# Patient Record
Sex: Male | Born: 2000 | Race: White | Hispanic: No | Marital: Single
Health system: Southern US, Community
[De-identification: ages and names within clinical notes are randomized; demographics above are authoritative.]

## PROBLEM LIST (undated history)

## (undated) DIAGNOSIS — F909 Attention-deficit hyperactivity disorder, unspecified type: Secondary | ICD-10-CM

## (undated) DIAGNOSIS — R011 Cardiac murmur, unspecified: Secondary | ICD-10-CM

## (undated) HISTORY — PX: ADENOIDECTOMY: SHX5191

## (undated) HISTORY — PX: TONSILLECTOMY: SUR1361

---
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
## 2021-04-09 ENCOUNTER — Ambulatory Visit: Payer: Medicaid Other | Admitting: Pediatrics

## 2021-04-10 ENCOUNTER — Ambulatory Visit (INDEPENDENT_AMBULATORY_CARE_PROVIDER_SITE_OTHER): Payer: Medicaid Other | Admitting: Pediatrics

## 2021-04-10 ENCOUNTER — Encounter: Payer: Self-pay | Admitting: Pediatrics

## 2021-04-10 ENCOUNTER — Other Ambulatory Visit (HOSPITAL_COMMUNITY)
Admission: RE | Admit: 2021-04-10 | Discharge: 2021-04-10 | Disposition: A | Discharge status: Home / Self Care | Payer: Medicaid Other | Source: Ambulatory Visit | Attending: Pediatrics | Admitting: Pediatrics

## 2021-04-10 ENCOUNTER — Other Ambulatory Visit: Payer: Self-pay

## 2021-04-10 VITALS — Wt 176.3 lb

## 2021-04-10 DIAGNOSIS — Z113 Encounter for screening for infections with a predominantly sexual mode of transmission: Secondary | ICD-10-CM

## 2021-04-10 DIAGNOSIS — L723 Sebaceous cyst: Secondary | ICD-10-CM

## 2021-04-10 NOTE — Progress Notes (Addendum)
   Subjective:    Patient ID: Drew Rasmussen, male    DOB: 01/06/2001, 20 y.o.   MRN: [id number]  HPI Chief Complaint  Patient presents with   SAME DAY    PAIN IN INNER RIGHT THIGH NEAR GROIN; NOTICED LAST Thursday AND STATED THAT THERE IS NO PAIN AND IT HAS GONE DOWN SINCE WHEN HE FIRST NOTICED.   Noticed a mass on his right inner thigh near groin last week when showering. It has decreased in size since then though he feels is has migrated superiorly Does not hurt or bother him. Denies drainage, redness, irritation, pruritus. Does not do any sports. Denies fever, urinary symptoms. No recent illnesses. Last sexual intercourse over a year ago, no concern for STIs but amenable for screening today Occasionally shaves genital area - none recently  Review of Systems  Constitutional:  Negative for fever.  Genitourinary:  Negative for dysuria.  Skin:  Negative for color change.       Mass right inner thigh      Objective:  Wt 176 lb 4.8 oz (80 kg)    Physical Exam Constitutional:      General: He is not in acute distress.    Appearance: Normal appearance.  HENT:     Head: Normocephalic and atraumatic.  Eyes:     Extraocular Movements: Extraocular movements intact.  Pulmonary:     Effort: No respiratory distress.  Genitourinary:    Comments: No inguinal lymphadenopathy. Musculoskeletal:     Cervical back: Neck supple.  Skin:    General: Skin is warm and dry.     Comments: Right superior medial thigh near groin with firm elongated mobile mass with small area of induration without erythema or drainage.  Neurological:     Mental Status: He is alert.          Assessment & Plan:   Sebaceous cyst Likely sebaceous (vs epidermoid) cyst near right groin, does not appear infected and reassuringly is improving. Reassured that it is small and freely moving. Consistency is not quite that of a lipoma. Small area of surrounding induration, perhaps due to pressure/irritation from being in  a higher-friction area; no associated erythema, ulceration, warmth, or inguinal adenopathy. - reassurance provided, signs of infection reviewed, return precautions given - If growing rapidly, can consider imaging in future.   Routine screening for STI - urine GC/chlamydia, HIV, RPR - condoms given  Adult provider list given  Littie Deeds, MD  PGY-2

## 2021-04-10 NOTE — Patient Instructions (Addendum)
Please give [place] a call if you are developing any new lumps or if there is any concern the area is getting infected with signs such as redness or drainage.  We will update you with your lab results on MyChart.  Adult Primary Care Clinics Name Criteria Services   [facility]  Address: [address] [place], [place] [address]  Phone: [phone] Hours: Monday - Friday 9 AM -6 PM  Types of insurance accepted:  Commercial insurance [place] [employer] (orange card) [employer] Uninsured  Language services:  Video and phone interpreters available   Ages 43 and older    Adult primary care Onsite pharmacy Integrated behavioral health Financial assistance counseling Walk-in hours for established patients  Financial assistance counseling hours: Tuesdays 2:00PM - 5:00PM  Thursday 8:30AM - 4:30PM  Space is limited, 10 on Tuesday and 20 on Thursday. It's on first come first serve basis  Name Criteria Services   [facility]  Address: [address] [place], [place] [address]  Phone: [phone]  Hours: Monday - Friday 8:30 AM - 5 PM  Types of insurance accepted:  Commercial insurance Medicaid Medicare Uninsured  Language services:  Video and phone interpreters available   All ages - newborn to adult   Primary care for all ages (children and adults) Integrated behavioral health Nutritionist Financial assistance counseling   Name Criteria Services   [facility]  Located on the ground floor of [facility]  Address: [address]  [place],  [place]  [address]  Phone: [phone]  Hours: Monday - Friday 8:15 AM - 5 PM  Types of insurance accepted:  Commercial insurance Medicaid Medicare Uninsured  Language services:  Video and phone interpreters available   Ages 65 and older   Adult primary care Nutritionist Certified Diabetes Educator   Integrated behavioral health Financial assistance counseling   Name Criteria Services   [facility]  Address: [address] [place], [place] [address]  Phone: [phone]  Hours: Monday - Friday 8:30 AM - 5 PM    Types of insurance accepted:  Nurse, learning disability Medicaid Medicare Uninsured  Language services:  Video and phone interpreters available   All ages - newborn to adult   Primary care for all ages (children and adults) Integrated behavioral health Financial assistance counseling

## 2021-04-13 LAB — URINE CYTOLOGY ANCILLARY ONLY
Chlamydia: NEGATIVE
Comment: NEGATIVE
Comment: NEGATIVE
Comment: NORMAL
Neisseria Gonorrhea: NEGATIVE
Trichomonas: NEGATIVE

## 2021-04-13 LAB — HIV ANTIBODY (ROUTINE TESTING W REFLEX): HIV 1&2 Ab, 4th Generation: NONREACTIVE

## 2021-04-13 LAB — RPR: RPR Ser Ql: NONREACTIVE

## 2022-02-11 DIAGNOSIS — M25531 Pain in right wrist: Secondary | ICD-10-CM | POA: Diagnosis not present

## 2023-03-17 DIAGNOSIS — J029 Acute pharyngitis, unspecified: Secondary | ICD-10-CM | POA: Diagnosis not present

## 2023-03-26 ENCOUNTER — Emergency Department (HOSPITAL_BASED_OUTPATIENT_CLINIC_OR_DEPARTMENT_OTHER): Payer: 59 | Admitting: Radiology

## 2023-03-26 ENCOUNTER — Emergency Department (HOSPITAL_BASED_OUTPATIENT_CLINIC_OR_DEPARTMENT_OTHER)
Admission: EM | Admit: 2023-03-26 | Discharge: 2023-03-26 | Disposition: A | Discharge status: Home / Self Care | Payer: 59 | Attending: Emergency Medicine | Admitting: Emergency Medicine

## 2023-03-26 ENCOUNTER — Other Ambulatory Visit (HOSPITAL_BASED_OUTPATIENT_CLINIC_OR_DEPARTMENT_OTHER): Payer: Self-pay

## 2023-03-26 ENCOUNTER — Other Ambulatory Visit: Payer: Self-pay

## 2023-03-26 ENCOUNTER — Encounter (HOSPITAL_BASED_OUTPATIENT_CLINIC_OR_DEPARTMENT_OTHER): Payer: Self-pay | Admitting: Emergency Medicine

## 2023-03-26 DIAGNOSIS — R29898 Other symptoms and signs involving the musculoskeletal system: Secondary | ICD-10-CM | POA: Diagnosis not present

## 2023-03-26 DIAGNOSIS — M79651 Pain in right thigh: Secondary | ICD-10-CM | POA: Diagnosis not present

## 2023-03-26 DIAGNOSIS — M5441 Lumbago with sciatica, right side: Secondary | ICD-10-CM | POA: Diagnosis not present

## 2023-03-26 DIAGNOSIS — M5137 Other intervertebral disc degeneration, lumbosacral region: Secondary | ICD-10-CM | POA: Diagnosis not present

## 2023-03-26 DIAGNOSIS — M4807 Spinal stenosis, lumbosacral region: Secondary | ICD-10-CM | POA: Insufficient documentation

## 2023-03-26 DIAGNOSIS — M5431 Sciatica, right side: Secondary | ICD-10-CM | POA: Insufficient documentation

## 2023-03-26 DIAGNOSIS — M79604 Pain in right leg: Secondary | ICD-10-CM

## 2023-03-26 DIAGNOSIS — M5127 Other intervertebral disc displacement, lumbosacral region: Secondary | ICD-10-CM | POA: Diagnosis not present

## 2023-03-26 DIAGNOSIS — M48061 Spinal stenosis, lumbar region without neurogenic claudication: Secondary | ICD-10-CM | POA: Diagnosis not present

## 2023-03-26 MED ORDER — OXYCODONE HCL 5 MG PO TABS
5.0000 mg | ORAL_TABLET | Freq: Once | ORAL | Status: AC
  Administered 2023-03-26: 5 mg via ORAL
  Filled 2023-03-26: qty 1

## 2023-03-26 MED ORDER — DEXAMETHASONE SODIUM PHOSPHATE 10 MG/ML IJ SOLN
10.0000 mg | Freq: Once | INTRAMUSCULAR | Status: DC
  Filled 2023-03-26: qty 1

## 2023-03-26 MED ORDER — IBUPROFEN 600 MG PO TABS
600.0000 mg | ORAL_TABLET | Freq: Four times a day (QID) | ORAL | 0 refills | Status: AC | PRN
  Filled 2023-03-26: qty 12, 3d supply, fill #0

## 2023-03-26 MED ORDER — LIDOCAINE 5 % EX PTCH
1.0000 | MEDICATED_PATCH | CUTANEOUS | 0 refills | Status: AC
  Filled 2023-03-26: qty 3, 3d supply, fill #0

## 2023-03-26 MED ORDER — KETOROLAC TROMETHAMINE 15 MG/ML IJ SOLN
15.0000 mg | Freq: Once | INTRAMUSCULAR | Status: AC
  Administered 2023-03-26: 15 mg via INTRAMUSCULAR
  Filled 2023-03-26: qty 1

## 2023-03-26 MED ORDER — CYCLOBENZAPRINE HCL 10 MG PO TABS: 10.0000 mg | ORAL_TABLET | Freq: Three times a day (TID) | ORAL | 0 refills | Status: DC | PRN

## 2023-03-26 MED ORDER — ACETAMINOPHEN 500 MG PO TABS
1000.0000 mg | ORAL_TABLET | Freq: Once | ORAL | Status: AC
  Administered 2023-03-26: 1000 mg via ORAL
  Filled 2023-03-26: qty 2

## 2023-03-26 MED ORDER — LIDOCAINE 5 % EX PTCH: 1.0000 | MEDICATED_PATCH | CUTANEOUS | 0 refills | Status: DC

## 2023-03-26 MED ORDER — IBUPROFEN 600 MG PO TABS: 600.0000 mg | ORAL_TABLET | Freq: Four times a day (QID) | ORAL | 0 refills | Status: DC | PRN

## 2023-03-26 MED ORDER — CYCLOBENZAPRINE HCL 10 MG PO TABS
10.0000 mg | ORAL_TABLET | Freq: Three times a day (TID) | ORAL | 0 refills | Status: AC | PRN
  Filled 2023-03-26: qty 15, 5d supply, fill #0

## 2023-03-26 MED ORDER — DEXAMETHASONE SODIUM PHOSPHATE 10 MG/ML IJ SOLN
10.0000 mg | Freq: Once | INTRAMUSCULAR | Status: AC
  Administered 2023-03-26: 10 mg via INTRAMUSCULAR

## 2023-03-26 MED ORDER — CYCLOBENZAPRINE HCL 10 MG PO TABS
10.0000 mg | ORAL_TABLET | Freq: Once | ORAL | Status: AC
  Administered 2023-03-26: 10 mg via ORAL
  Filled 2023-03-26: qty 1

## 2023-03-26 NOTE — ED Triage Notes (Signed)
Pt took 1500 mg tyelnol at 0530

## 2023-03-26 NOTE — ED Provider Notes (Signed)
[place] EMERGENCY DEPARTMENT AT [facility] Provider Note   CSN: [id number] Arrival date & time: 03/26/23  4098     History  Chief Complaint  Patient presents with   Leg Pain    Drew Rasmussen is a 22 y.o. male with past medical history ADHD who presents to the ED complaining of pain to the right thigh that started yesterday.  He states that today he began to notice pain is on the right buttock and down the right leg.  No pain in the lower back.  No fever, chills, abdominal pain, testicular pain or swelling, bowel or bladder dysfunction, urinary symptoms, neck pain, paresthesias, or other complaints. States increased pain with bearing weight on LE and walking. No history of these symptoms. No fall or injury.       Home Medications No daily medications  Allergies    Patient has no known allergies.    Review of Systems   Review of Systems  All other systems reviewed and are negative.   Physical Exam Updated Vital Signs BP (!) 148/78   Pulse 74   Temp 98 F (36.7 C) (Oral)   Resp 20   SpO2 100%  Physical Exam Vitals and nursing note reviewed.  Constitutional:      General: He is not in acute distress.    Appearance: Normal appearance. He is not ill-appearing, toxic-appearing or diaphoretic.  HENT:     Head: Normocephalic and atraumatic.     Mouth/Throat:     Mouth: Mucous membranes are moist.  Eyes:     General: No scleral icterus.    Extraocular Movements: Extraocular movements intact.     Conjunctiva/sclera: Conjunctivae normal.     Pupils: Pupils are equal, round, and reactive to light.  Cardiovascular:     Rate and Rhythm: Normal rate and regular rhythm.     Heart sounds: No murmur heard. Pulmonary:     Effort: Pulmonary effort is normal.     Breath sounds: Normal breath sounds.  Abdominal:     General: Abdomen is flat. There is no distension.     Palpations: Abdomen is soft.     Tenderness: There is no abdominal tenderness. There is no right  CVA tenderness, left CVA tenderness, guarding or rebound.  Musculoskeletal:        General: No deformity.     Cervical back: Normal range of motion and neck supple. No rigidity.     Right lower leg: No edema.     Left lower leg: No edema.     Comments: No midline CTL spinal tenderness, stepoffs, or deformities; no bony tenderness over bilateral lower extremities, no palpable deformity, passive range of motion of bilateral lower extremities intact, positive SLR on right, increase in pain with active range of motion of right proximal lower extremity, no overlying skin changes  Skin:    General: Skin is warm and dry.     Capillary Refill: Capillary refill takes less than 2 seconds.  Neurological:     Mental Status: He is alert and oriented to person, place, and time.     GCS: GCS eye subscore is 4. GCS verbal subscore is 5. GCS motor subscore is 6.     Cranial Nerves: Cranial nerves 2-12 are intact. No cranial nerve deficit, dysarthria or facial asymmetry.     Sensory: Sensation is intact.     Motor: No tremor, atrophy, abnormal muscle tone or seizure activity.     Deep Tendon Reflexes:  Reflex Scores:      Patellar reflexes are 2+ on the right side and 2+ on the left side. Psychiatric:        Behavior: Behavior normal.     ED Results / Procedures / Treatments   Labs (all labs ordered are listed, but only abnormal results are displayed) Labs Reviewed - No data to display  EKG None  Radiology DG Lumbar Spine 2-3 Views  Result Date: 03/26/2023 CLINICAL DATA:  Right buttock pain. Right right leg weakness. Right groin was stinging yesterday. Now whole right leg hurts. EXAM: LUMBAR SPINE - 2-3 VIEW COMPARISON:  Chest radiographs 12/07/2012 FINDINGS: There is transitional lumbosacral anatomy. Counting down from T1 on prior chest radiographs, there are 12 dominant rib-bearing thoracic type vertebral bodies. The next vertebral body has transitional right-greater-than-left transverse  processes versus tiny ribs. This is considered L1. There are considered to be 5 lumbar-type vertebral bodies. There is partial lumbarization of S1. There is 4 mm retrolisthesis of L5 on S1. There is a developmental rudimentary S1-S2 disc space. Mild posterior L5-S1 disc space narrowing. Vertebral body heights are maintained. IMPRESSION: 1. 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 2. Mild retrolisthesis and posterior disc space narrowing at L5-S1. Electronically Signed   By: Neita Garnet M.D.   On: 03/26/2023 09:37    Procedures Procedures    Medications Ordered in ED Medications  oxyCODONE (Oxy IR/ROXICODONE) immediate release tablet 5 mg (has no administration in time range)  ketorolac (TORADOL) 15 MG/ML injection 15 mg (15 mg Intramuscular Given 03/26/23 0941)  cyclobenzaprine (FLEXERIL) tablet 10 mg (10 mg Oral Given 03/26/23 0940)  acetaminophen (TYLENOL) tablet 1,000 mg (1,000 mg Oral Given 03/26/23 0940)  dexamethasone (DECADRON) injection 10 mg (10 mg Intramuscular Given 03/26/23 0941)    ED Course/ Medical Decision Making/ A&P                             Medical Decision Making Amount and/or Complexity of Data Reviewed Radiology: ordered.  Risk OTC drugs. Prescription drug management.   Medical Decision Making:   Drew Rasmussen is a 22 y.o. male who presented to the ED today with leg pain detailed above.    Additional history discussed with patient's family/caregivers.  Complete initial physical exam performed, notably the patient was in NAD. No midline spinal tenderness. No MSK deformities. Sensation intact.  No meningismus. Reviewed and confirmed nursing documentation for past medical history, family history, social history.    Initial Assessment:   With the patient's presentation, differential diagnosis includes but is not limited to muscle strain, sciatica, fracture, dislocation, acute abdomen, disk herniation.  This is most consistent with an  acute complicated illness  Initial Plan:  XR to assess for bony pathology Symptomatic management Objective evaluation as below reviewed   Initial Study Results:   Radiology:  All images reviewed independently. Agree with radiology report at this time.   DG Lumbar Spine 2-3 Views  Result Date: 03/26/2023 CLINICAL DATA:  Right buttock pain. Right right leg weakness. Right groin was stinging yesterday. Now whole right leg hurts. EXAM: LUMBAR SPINE - 2-3 VIEW COMPARISON:  Chest radiographs 12/07/2012 FINDINGS: There is transitional lumbosacral anatomy. Counting down from T1 on prior chest radiographs, there are 12 dominant rib-bearing thoracic type vertebral bodies. The next vertebral body has transitional right-greater-than-left transverse processes versus tiny ribs. This is considered L1. There are considered to be 5 lumbar-type vertebral bodies. There is partial lumbarization of  S1. There is 4 mm retrolisthesis of L5 on S1. There is a developmental rudimentary S1-S2 disc space. Mild posterior L5-S1 disc space narrowing. Vertebral body heights are maintained. IMPRESSION: 1. 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 2. Mild retrolisthesis and posterior disc space narrowing at L5-S1. Electronically Signed   By: Neita Garnet M.D.   On: 03/26/2023 09:37      Final Assessment and Plan:   22 year old male presents to the ED complaining of right leg pain.  No injury.  No midline spinal tenderness or step-offs.  Increased pain with weightbearing and ambulation.  Pain somewhat in a sciatic pattern from the buttock down the right lower extremity.  Positive straight leg raise.  X-ray with findings of suspected spinal stenosis.  Reflexes intact.  No red flag symptoms.  Suspect acute sciatica.  Discussed in detail with patient and mother at bedside.  Given Toradol, Decadron, Tylenol, Flexeril here for symptomatic management as well as dose of oxycodone for continued pain.  Will prescribe  Flexeril and NSAIDs for home as well as encourage close outpatient follow-up which patient is agreeable with.  Strict ED return precautions given, all questions answered, and stable for discharge.   Clinical Impression:  1. Sciatica of right side   2. Spinal stenosis of lumbosacral region   3. Right leg pain      Discharge           Final Clinical Impression(s) / ED Diagnoses Final diagnoses:  Spinal stenosis of lumbosacral region  Sciatica of right side  Right leg pain    Rx / DC Orders ED Discharge Orders          Ordered    cyclobenzaprine (FLEXERIL) 10 MG tablet  3 times daily PRN        03/26/23 0945    ibuprofen (ADVIL) 600 MG tablet  Every 6 hours PRN        03/26/23 0945    lidocaine (LIDODERM) 5 %  Every 24 hours        03/26/23 0945              Tonette Lederer, PA-C 03/26/23 1038    [place], Adam, DO 03/26/23 1329

## 2023-03-26 NOTE — Discharge Instructions (Addendum)
Thank you for letting us take care of you today.  Your XR shows a condition called spinal stenosis in your lower back. This is likely causing a condition called sciatica which explains the pain in your leg. We treat this with muscle relaxers and NSAIDs. We gave you your first dose of these medications and a steroid injection in the ED. I am sending you home with similar medications.  Usually these symptoms improve with symptomatic treatment. Some people will need additional medications, physical therapy, or referral to a specialist however for further treatment. Follow up with your PCP next week to discuss your ED visit today and any continued symptoms.   If you develop worsening symptoms such as loss of bowel or bladder control, fever, abdominal pain, testicular pain or swelling, uncontrolled pain, or other new, concerning symptoms return to nearest ED for re-evaluation.

## 2023-03-26 NOTE — ED Notes (Signed)
Discharge instructions, follow up care, pain management and prescriptions reviewed and explained, pt verbalized understanding and had no further questions on d/c. Pt caox4, ambulatory on d/c.

## 2023-03-26 NOTE — ED Triage Notes (Signed)
Right groin was hurting/stinging yesterday, today has moved to his whole right leg.no injury

## 2023-04-25 ENCOUNTER — Ambulatory Visit: Payer: 59 | Admitting: Pediatrics

## 2023-04-25 NOTE — Progress Notes (Deleted)
Adolescent Well Care Visit Drew Rasmussen is a 22 y.o. male who is here for well care.    PCP:  Roxy Horseman, MD   History was provided by the {CHL AMB PERSONS; PED RELATIVES/OTHER W/PATIENT:[phone]}.  Confidentiality was discussed with the patient and, if applicable, with caregiver as well. Patient's personal or confidential phone number: ***  Current Issues: Current concerns include ***.   History of: - ADHD - previously on Vyvanse, last visit was 4 years ago in 2020 - last [facility] was 2019  Nutrition: Nutrition/eating behaviors: *** Adequate calcium in diet?: *** Supplements/ vitamins: ***  Exercise/ Media: Play any sports? *** Exercise: *** Screen time:  {CHL AMB SCREEN TIME:[id number]} Media rules or monitoring?: {YES NO:22349}  Sleep:  Sleep: ***  Social Screening: Lives with:  *** Parental relations:  {CHL AMB PED FAM RELATIONSHIPS:[phone]} Activities, work, and chores?: *** Concerns regarding behavior with peers?  {yes***/no:17258} Stressors of note: {Responses; yes**/no:17258}  Education: School grade and name: ***  School performance: {performance:16655} School behavior: {misc; parental coping:16655}  Menstruation:   No LMP for male patient. Menstrual history: ***   Tobacco?  {YES/NO/WILD CARDS:18581} Secondhand smoke exposure?  {YES/NO/WILD WUJWJ:19147} Drugs/ETOH?  {YES/NO/WILD WGNFA:21308}  Sexually Active?  {YES [id number]   Pregnancy Prevention: ***  Safe at home, in school & in relationships?  {Yes or If no, why not?:20788} Safe to self?  {Yes or If no, why not?:20788}   Screenings: Patient has a dental home: {yes/no***:64::"yes"}  The patient completed the Rapid Assessment for Adolescent Preventive Services screening questionnaire and the following topics were identified as risk factors and discussed: {CHL AMB ASSESSMENT TOPICS:21012045} and counseling provided.  Other topics of anticipatory guidance related to reproductive health,  substance use and media use were discussed.     PHQ-9 completed and results indicated ***  Physical Exam:  There were no vitals filed for this visit. There were no vitals taken for this visit. Body mass index: body mass index is unknown because there is no height or weight on file. Growth %ile SmartLinks can only be used for patients less than 30 years old.  No results found.  General Appearance:   {PE GENERAL APPEARANCE:22457}  HENT: normocephalic, no obvious abnormality, conjunctiva clear  Mouth:   oropharynx moist, palate, tongue and gums normal; teeth ***  Neck:   supple, no adenopathy; thyroid: symmetric, no enlargement, no tenderness/mass/nodules  Chest Normal male male with breasts: {EXAMLarrie Kass  Lungs:   clear to auscultation bilaterally, even air movement   Heart:   regular rate and rhythm, S1 and S2 normal, no murmurs   Abdomen:   soft, non-tender, normal bowel sounds; no mass, or organomegaly  GU {adol gu exam:315266}  Musculoskeletal:   tone and strength strong and symmetrical, all extremities full range of motion           Lymphatic:   no adenopathy  Skin/Hair/Nails:   skin warm and dry; no bruises, no rashes, no lesions  Neurologic:   oriented, no focal deficits; strength, gait, and coordination normal and age-appropriate     Assessment and Plan:   ***  BMI {ACTION; IS/IS MVH:[id number]} appropriate for age  Hearing screening result:{normal/abnormal/not examined:14677} Vision screening result: {normal/abnormal/not examined:14677}  Counseling provided for {CHL AMB PED VACCINE COUNSELING:210130100} vaccine components No orders of the defined types were placed in this encounter.    No follow-ups on file.Renato Gails, MD

## 2023-07-11 DIAGNOSIS — J00 Acute nasopharyngitis [common cold]: Secondary | ICD-10-CM | POA: Diagnosis not present
